# Patient Record
Sex: Female | Born: 1985 | Race: Black or African American | Hispanic: No | Marital: Single | Smoking: Never smoker
Health system: Southern US, Community
[De-identification: ages and names within clinical notes are randomized; demographics above are authoritative.]

## PROBLEM LIST (undated history)

## (undated) DIAGNOSIS — B977 Papillomavirus as the cause of diseases classified elsewhere: Secondary | ICD-10-CM

## (undated) HISTORY — PX: OTHER SURGICAL HISTORY: SHX169

## (undated) HISTORY — PX: TUBAL LIGATION: SHX77

---
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
## 2004-03-15 ENCOUNTER — Other Ambulatory Visit: Admission: RE | Admit: 2004-03-15 | Discharge: 2004-03-15 | Payer: Self-pay | Admitting: Obstetrics and Gynecology

## 2004-03-18 ENCOUNTER — Other Ambulatory Visit: Admission: RE | Admit: 2004-03-18 | Discharge: 2004-03-18 | Payer: Self-pay | Admitting: Obstetrics & Gynecology

## 2004-04-29 ENCOUNTER — Ambulatory Visit (HOSPITAL_COMMUNITY): Admission: RE | Admit: 2004-04-29 | Discharge: 2004-04-29 | Payer: Self-pay | Admitting: Obstetrics and Gynecology

## 2004-07-15 ENCOUNTER — Other Ambulatory Visit: Admission: RE | Admit: 2004-07-15 | Discharge: 2004-07-15 | Payer: Self-pay | Admitting: Obstetrics and Gynecology

## 2004-09-21 ENCOUNTER — Inpatient Hospital Stay (HOSPITAL_COMMUNITY): Admission: AD | Admit: 2004-09-21 | Discharge: 2004-09-21 | Payer: Self-pay | Admitting: Obstetrics and Gynecology

## 2004-09-29 ENCOUNTER — Inpatient Hospital Stay (HOSPITAL_COMMUNITY): Admission: AD | Admit: 2004-09-29 | Discharge: 2004-10-01 | Payer: Self-pay | Admitting: Obstetrics and Gynecology

## 2005-03-16 IMAGING — US US ABDOMEN COMPLETE
1 series · 14 of 25 positions shown · non-contrast
Comparison: none

CLINICAL DATA: Abdominal pain.
 COMPLETE ABDOMINAL ULTRASOUND 12/28/03 
 The gallbladder is normal in appearance.  There is no biliary ductal dilation as the common duct measures 4 mm.  The liver is normal in size and echo texture and contains no focal abnormalities.  The inferior vena cava is patent.  The pancreas is well-visualized and is normal.  The spleen is normal in size and appearance and measures 7.4 cm.  
 Both kidneys are normal in size and echo texture and contain no focal abnormalities.  There is no hydronephrosis.  There are no focal parenchymal abnormalities.  The right kidney measures 10.7 cm and the left 10.7 cm.  The abdominal aorta is of normal caliber through its visualized course in the abdomen. 
 IMPRESSION
 Normal abdominal ultrasound.

[Series 1: abdomen · 0.33mm/px · 14 of 78 slices shown]
[im 1/78]
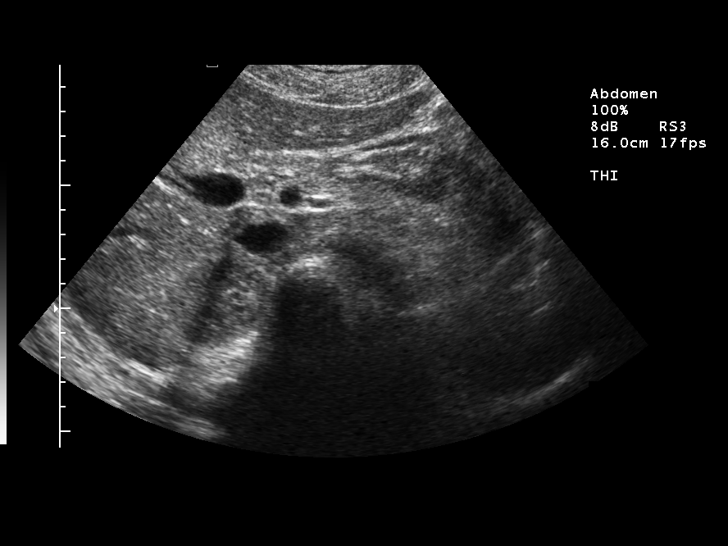
[im 7/78]
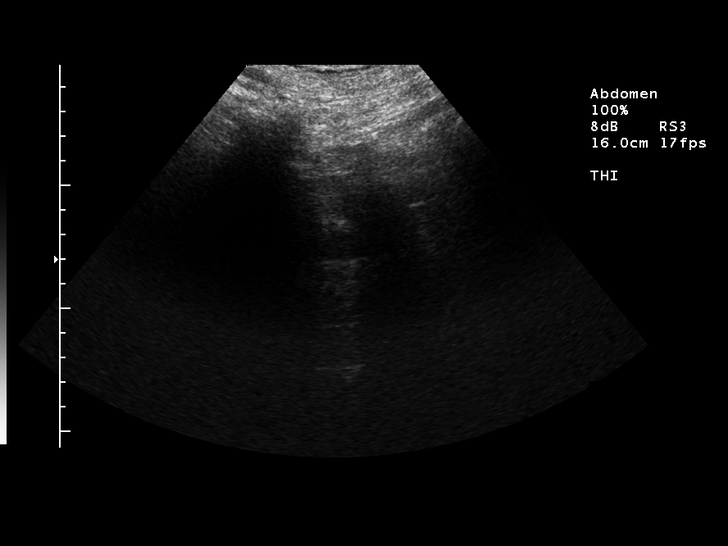
[im 13/78]
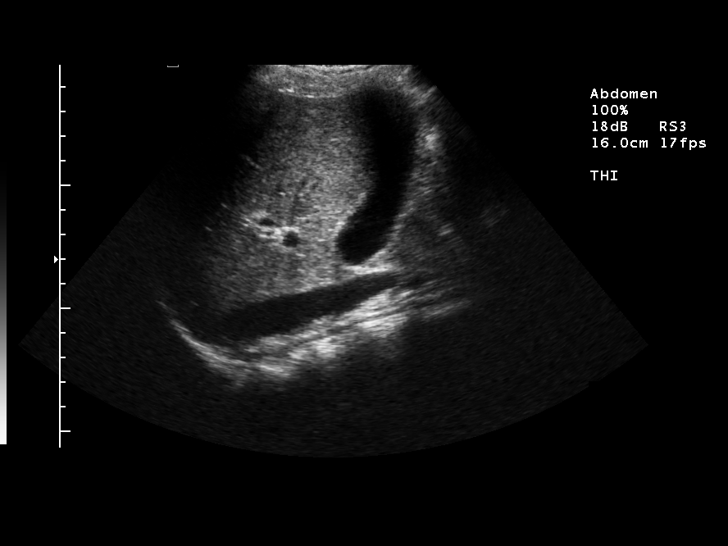
[im 20/78]
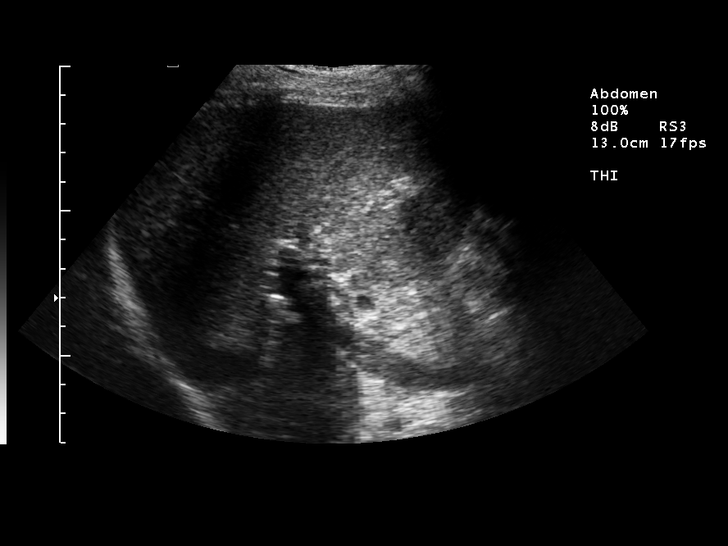
[im 26/78]
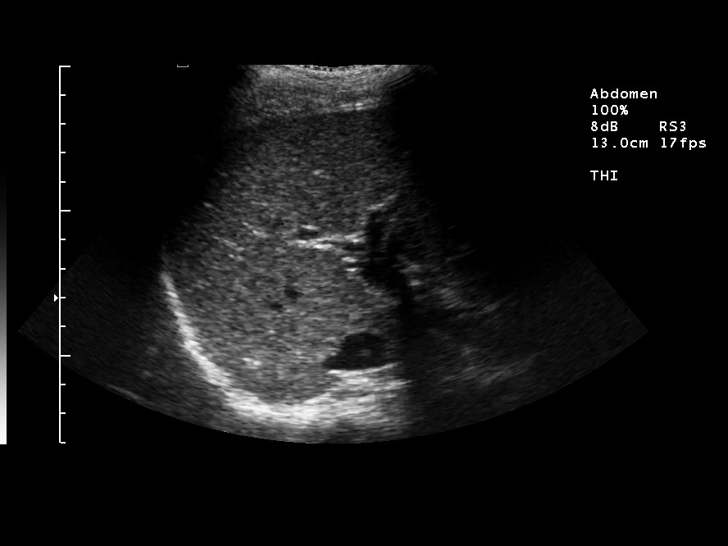
[im 29/78]
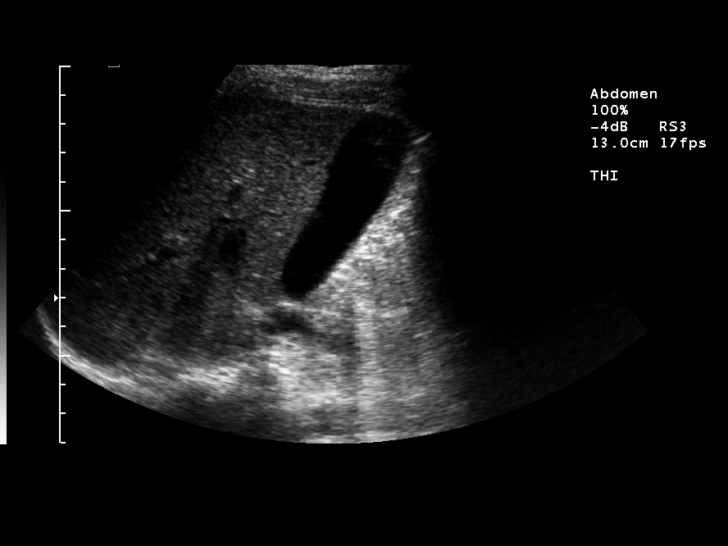
[im 36/78]
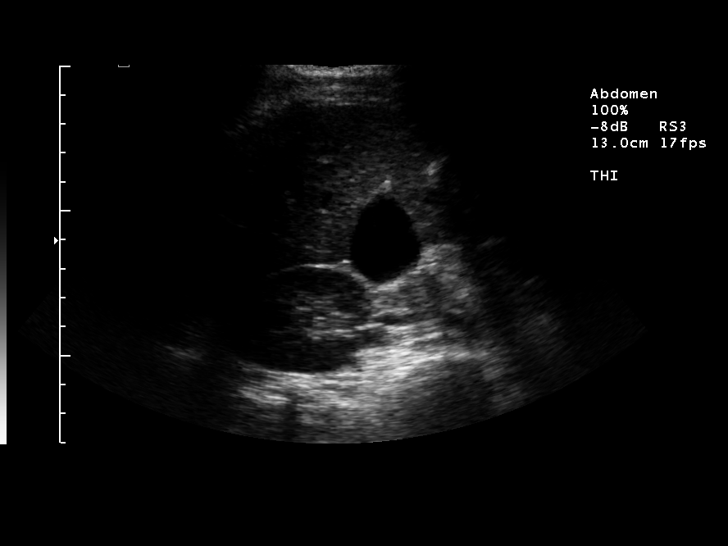
[im 42/78]
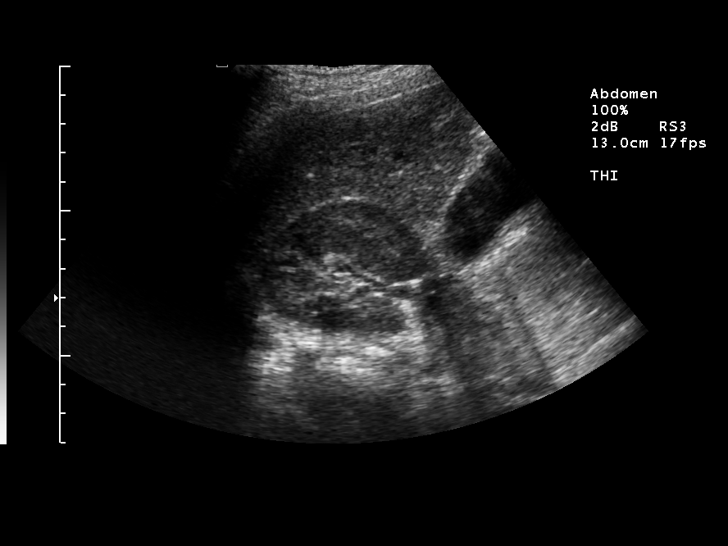
[im 49/78]
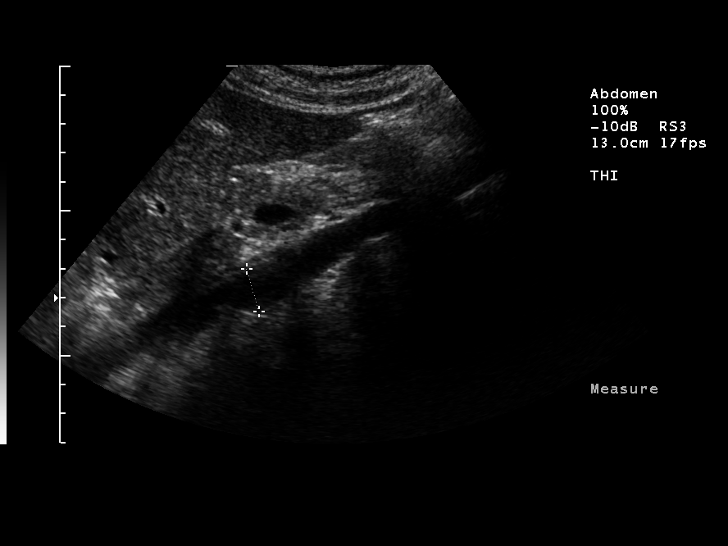
[im 52/78]
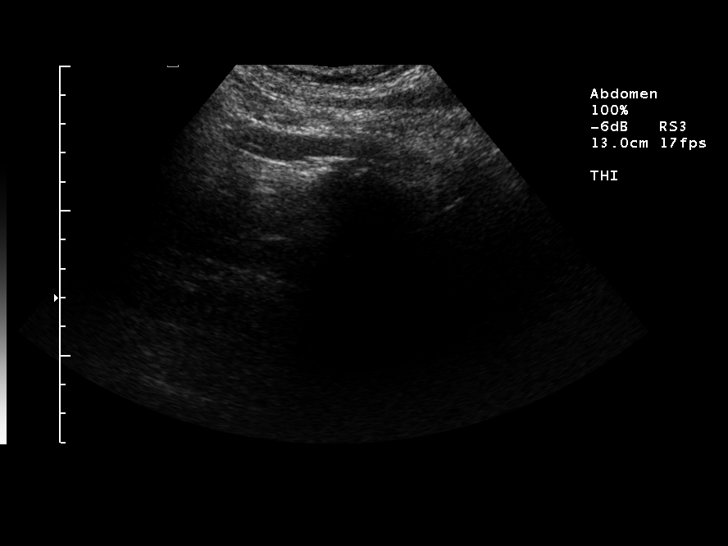
[im 58/78]
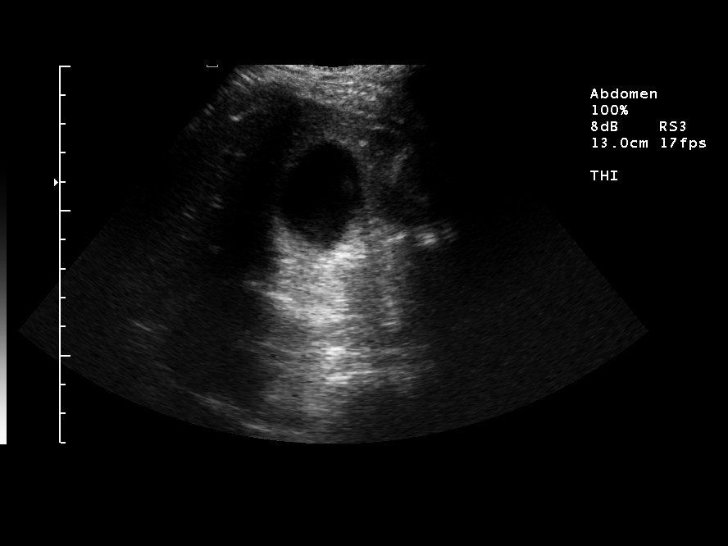
[im 65/78]
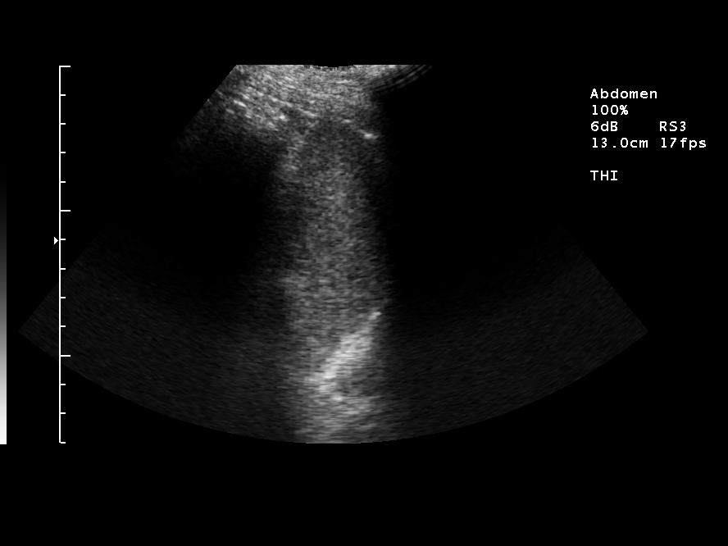
[im 71/78]
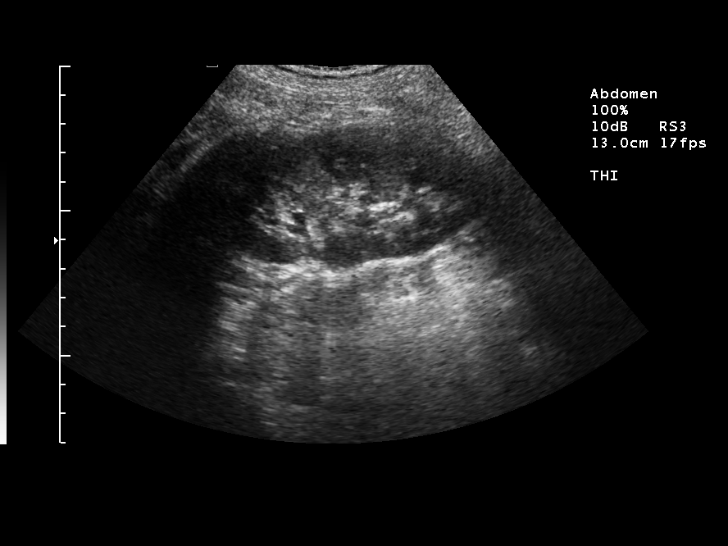
[im 78/78]
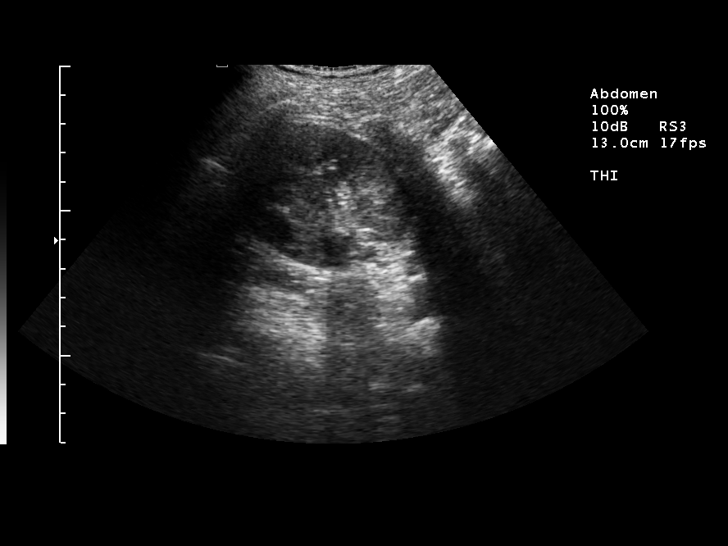

[14 of 25 positions shown; findings below may reference images not displayed]

## 2005-07-17 IMAGING — US US OB COMP +14 WK
1 series · 2 of 2 positions shown · non-contrast
Comparison: none

CLINICAL DATA: G2 P0 AB1.  LMP 12/19/03.  Assess fetal anatomy and gestational age.

[Series 1: unknown · 0.25mm/px · 2 of 2 slices shown]
[im 1/2]
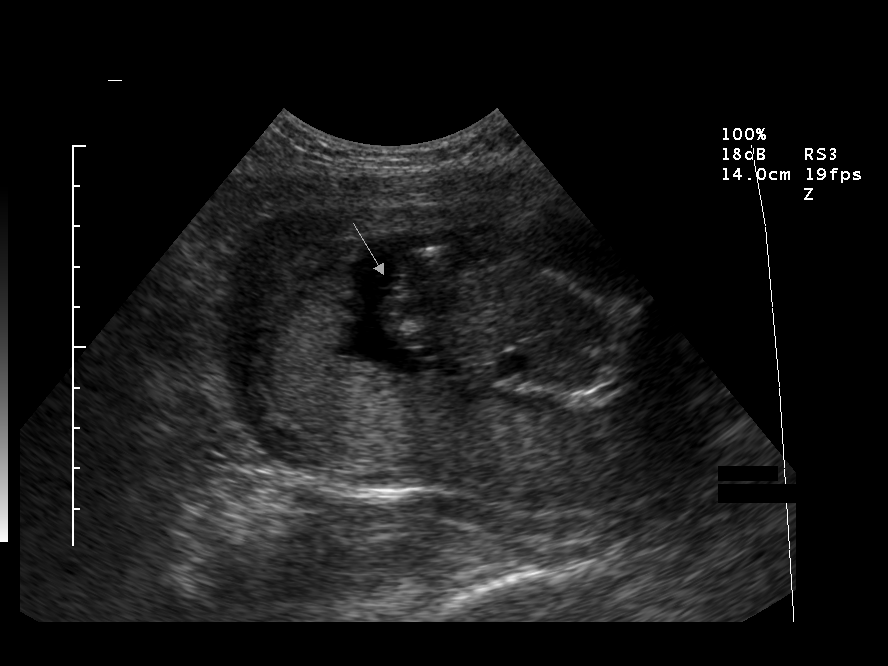
[im 2/2]
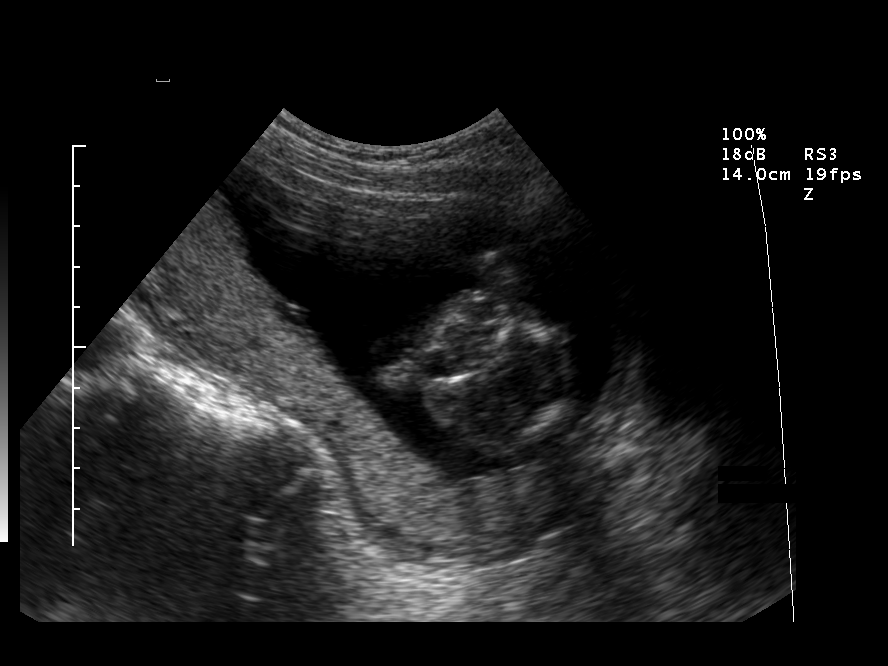

[2 of 2 positions shown; findings below may reference images not displayed]

OBSTETRICAL ULTRASOUND WTH TRANSVAGINAL:
Number of Fetuses:  1
Heart Rate:  153
Movement:  Yes
Breathing:    No  
Presentation:  Cephalic 
Placental Location:  Posterior
Grade:  I
Previa:  Asymmetric complete
Amniotic Fluid (Subjective):  Normal      Amniotic Fluid (Objective):  3.5 cm Vertical pocket 
FETAL BIOMETRY
BPD:    3.6 cm   17 w 0 d
HC:  12.5 cm   16 w 2 d
AC:  10.1 cm   16 w 1 d
FL:    2.0 cm   16 w 0 d

MEAN GA:  16 w 3 d

FETAL ANATOMY
Lateral Ventricles:    Visualized 
Thalami/CSP:      Visualized 
Posterior Fossa:  Visualized 
Nuchal Region:    Visualized 
Spine:      Visualized 
4 Chamber Heart on Left:      Visualized 
Stomach on Left:      Visualized 
3 Vessel Cord:    Visualized 
Cord Insertion site:    Visualized 
Kidneys:  Visualized 
Bladder:  Visualized 
Extremities:      Visualized 

ADDITIONAL ANATOMY VISUALIZED:  LVOT, RVOT, upper, lip, orbits, profile, diaphragm, heel, 5th digit, ductal arch, aortic arch, and female genitalia

MATERNAL FINDINGS
Cervix:   4.3 cm Transvaginally
IMPRESSION: Single living intrauterine fetus in cephalic presentation.  Patient is 18 weeks 6 days by LMP dating, but measures 16 weeks 3 days today suggesting that dates may be inaccurate.  
No focal fetal abnormality identified.  Posterior asymmetric complete placenta previa.  The lower margin is just covering the internal os.  Suggest follow-up study later in pregnancy.  
Normal amniotic fluid volume.

</u12:p>

## 2005-11-11 ENCOUNTER — Emergency Department (HOSPITAL_COMMUNITY): Admission: EM | Admit: 2005-11-11 | Discharge: 2005-11-11 | Payer: Self-pay | Admitting: Emergency Medicine

## 2006-07-04 ENCOUNTER — Inpatient Hospital Stay (HOSPITAL_COMMUNITY): Admission: AD | Admit: 2006-07-04 | Discharge: 2006-07-06 | Payer: Self-pay | Admitting: Obstetrics and Gynecology

## 2006-07-31 ENCOUNTER — Ambulatory Visit (HOSPITAL_COMMUNITY): Admission: RE | Admit: 2006-07-31 | Discharge: 2006-07-31 | Payer: Self-pay | Admitting: Obstetrics & Gynecology

## 2008-02-19 ENCOUNTER — Emergency Department (HOSPITAL_COMMUNITY): Admission: EM | Admit: 2008-02-19 | Discharge: 2008-02-19 | Payer: Self-pay | Admitting: Emergency Medicine

## 2008-04-28 ENCOUNTER — Emergency Department (HOSPITAL_COMMUNITY): Admission: EM | Admit: 2008-04-28 | Discharge: 2008-04-28 | Payer: Self-pay | Admitting: Emergency Medicine

## 2008-05-30 ENCOUNTER — Emergency Department (HOSPITAL_COMMUNITY): Admission: EM | Admit: 2008-05-30 | Discharge: 2008-05-30 | Payer: Self-pay | Admitting: Emergency Medicine

## 2009-01-10 ENCOUNTER — Emergency Department (HOSPITAL_COMMUNITY): Admission: EM | Admit: 2009-01-10 | Discharge: 2009-01-10 | Payer: Self-pay | Admitting: Family Medicine

## 2010-04-30 ENCOUNTER — Emergency Department (HOSPITAL_COMMUNITY): Admission: EM | Admit: 2010-04-30 | Discharge: 2010-05-01 | Payer: Self-pay | Admitting: Emergency Medicine

## 2011-01-17 NOTE — Op Note (Signed)
[facility]  Patient:    Lisa Luna, Lisa Luna Visit Number: [id number] MRN: [id number]          Service Type: DSU Location: [facility] Attending Physician:  Mickle Mallory Dictated by:   Gerrit Friends. Aldona Bar, M.D. Proc. Date: 03/11/02 Admit Date:  03/11/2002 Discharge Date: 03/11/2002                             Operative Report  PREOPERATIVE DIAGNOSIS:       First trimester pregnancy loss/inevitable miscarriage/blood type B positive.  POSTOPERATIVE DIAGNOSIS:      First trimester pregnancy loss/inevitable miscarriage/blood type B positive.  Pathology pending.  OPERATION:                    Suction dilatation and curettage for evacuation of first trimester pregnancy loss.  SURGEON:                      Gerrit Friends. Aldona Bar, M.D.  INDICATIONS:                  This 25 year old female was seen in the office today having called last evening because of cramping and bleeding.  She had been previously seen in the office early on this pregnancy and had been dated appropriately - her gestational age on previous ultrasound was consistent with her last menstrual period which would put her at approximately [redacted] weeks gestation at this time.  She was seen in the office this morning as mentioned, and there was a small amount of bleeding coming from the cervical os.  No fetal heart tone was heard, and on bimanual exam, the uterus was not consistent with that of a 13 week size uterus.  An ultrasound was done with findings consistent with approximately a 9 to 9-1/2 intrauterine pregnancy - a fetal pole was seen but no fetal heart was seen.  A diagnosis of a first trimester pregnancy loss/inevitable abortion was made.  The patient was sent to [facility] where she had a confirmatory ultrasound consistent with my ultrasound in the office.  She is now taken to the operating room for evacuation of first trimester pregnancy loss.  DESCRIPTION OF PROCEDURE:  The patient  was taken to the operating room where after the satisfactory induction of intravenous conscious sedation, she was prepped and draped having been placed in the usual position - modified lithotomy in the short Allen stirrups.  After she was adequately prepped, the bladder was drained of clear urine with the red rubber catheter in an in and out fashion.  At this time, a speculum was placed in the vagina.  The cervix was well-visualized, and a single-tooth tenaculum was attached to the cervix for uterine stability during the procedure.  A paracervical block was carried out at this time with 1% Xylocaine without epinephrine - amounting to approximately 15 cc.  Thereafter, with minimal difficulty, the internal os was dilated to a #31 Pratt dilator and with ease a #10 suction curet was then used to thoroughly evacuate the cavity.  Curettage was continued until it was felt to be complete.  Thereafter using the medium standard curet, the cavity was curetted and noted to be free of any remaining products of conception.  Resuctioning produced no additional tissue.  At this time, the procedure was felt to be complete.  All instruments were removed.  The patient at this time was transported to the  recovery room in satisfactory condition having tolerated the procedure well.  Estimated blood loss was 25 cc.  All counts were correct x2.  Specimens consisted of products of conception.  The patient will be discharged to home with appropriate instruction sheet.  DISCHARGE MEDICATIONS:        Doxycycline 100 mg twice daily for a total of four days and Anaprox DS one every eight hours as needed for discomfort or cramps.  CONDITION:                    Condition on arrival in the recovery room is satisfactory.   DESCRIPTION OF PROCEDURE: Dictated by:   Gerrit Friends. Aldona Bar, M.D. Attending Physician:  Mickle Mallory DD:  03/11/02 TD:  03/14/02 Job: [id number]

## 2011-01-17 NOTE — Op Note (Signed)
NAMEQUANTINA, DERSHEM            ACCOUNT NO.:  [id number]   MEDICAL RECORD NO.:  [id number]          PATIENT TYPE:  AMB   LOCATION:  SDC                           FACILITY:  [facility]   PHYSICIAN:  Randye Lobo, M.D.   DATE OF BIRTH:  Apr 09, 1986   DATE OF PROCEDURE:  07/31/2006  DATE OF DISCHARGE:                               OPERATIVE REPORT   PREOPERATIVE DIAGNOSIS:  Multiparous female, desires permanent  sterilization.   POSTOPERATIVE DIAGNOSIS:  Multiparous female, desires permanent  sterilization.   PROCEDURE:  Laparoscopic bilateral tubal ligation with Filshie clips.   SURGEON:  Randye Lobo, M.D.   ANESTHESIA:  General endotracheal, local with 0.25% Marcaine.   ESTIMATED BLOOD LOSS:  Minimal.   URINE OUTPUT:  100 mL by I&O catheter..   COMPLICATIONS:  None.   INDICATIONS FOR THE PROCEDURE:  The patient is a 25 year old gravida 3,  para 2-0-0-2 African American female status post normal spontaneous  vaginal delivery of a viable female on 07/04/2006 who throughout her  pregnancy and post partum requested permanent sterilization.  The  patient declines reversible contraception; and she chooses to have  permanent sterilization; and states that she will have no regret.  A  discussion was held with the patient regarding other options for  reversible contraception which she has declined.  She understands that  there is a failure rate of a tubal ligation of approximately 1:250 to  1:300 which may result in either an intrauterine or an ectopic  pregnancy.  The patient chooses to proceed.   FINDINGS:  Laparoscopy demonstrates a normal uterus, tubes and ovaries.  There is no evidence of any endometriosis or adhesive disease in the  abdomen or pelvis.  The liver and gallbladder are unremarkable.  The  appendix appears normal.   SPECIMENS:  None.   DESCRIPTION OF PROCEDURE:  The patient is reidentified in the  preoperative hold area.  She received Ancef 1 gram IV for  antibiotic  prophylaxis.  In the operating room the patient received general  endotracheal anesthesia.  She was then placed in the dorsal lithotomy  position.  The abdomen and vagina were sterilely prepped and draped.  The patient's bladder was catheterized of urine.  A speculum was placed  inside the vagina and a single-tooth tenaculum was placed on the  anterior cervical lip and this was replaced by a Hulka tenaculum.  The  speculum was then withdrawn.   The procedure began by making a 1.5 cm vertical umbilical incision with  a scalpel.  The abdominal wall was noted to be very thin, at this site.  The umbilicus was elevated with two Allis clamps as the skin was incised  and this incision allowed direct entry into the peritoneal cavity.  The  10-mm trocar was placed in the abdomen and the laparoscope did confirm  proper placement.  A pneumoperitoneum was achieved.  An inspection of  the pelvic and abdominal organs was performed and the findings were as  noted above.  A 10-mm operative laparoscope was then used to perform the  tubal ligation with Filshie  clips.  The Filshie  clip was placed inside  the clip applicator and the laparoscope and the Filshie clip applier  were placed inside the peritoneal cavity.  The right fallopian tube was  followed all the way to its fimbriated end.  It was then grasped along  the isthmic portion, and the Filshie clip was applied without  difficulty.  This was noted to have good and complete placement across  the entire isthmic portion of the tube.  The same procedure that was  performed on the right-hand side was then repeated on the left fallopian  tube, after that fallopian tube was followed to its fimbriated end.   The operative sites were noted to be hemostatic at this time.  There  were no complications to the procedure.  The laparoscope and Filshie  clip applicator were removed; and the pneumoperitoneum was released.  The umbilical trocar was  removed.   The umbilical fascia was closed with a figure-of-eight suture of #0  Vicryl.  The skin was then closed with subcuticular sutures of 3-0  plain.  Local 0.25% Marcaine was injected locally in the umbilical  incisions.  A sterile bandage was placed over the incision.   The Hulka tenaculum was removed from the vagina.  This concluded the  patient's procedure.  There were no complications.  All needle,  instrument, and sponge counts were correct.      Randye Lobo, M.D.  Electronically Signed     BES/MEDQ  D:  07/31/2006  T:  07/31/2006  Job:  [id number]

## 2011-01-17 NOTE — Op Note (Signed)
[place]  Patient:    Lisa Luna                    MRN: [id number] Proc. Date: 07/23/99 Adm. Date:  [id number] Attending:  Ronne Binning CC:         Nicki Reaper, M.D. (2)                           Operative Report  PREOPERATIVE DIAGNOSIS:  Rupture, ulnar collateral ligament, with a fracture of the proximal phalanx, metacarpal-phalangeal joint, left thumb.  POSTOPERATIVE DIAGNOSIS:  Rupture, ulnar collateral ligament, with a fracture of the proximal phalanx, metacarpal-phalangeal joint, left thumb.  OPERATION:  Open reduction and internal fixation of the proximal phalanx, intra-articular fracture, left thumb.  SURGEON:  Nicki Reaper, M.D.  ASSISTANT:  Joaquin Courts, R.N.  ANESTHESIA:  General.  ANESTHESIOLOGIST:  Edwin Cap. Zoila Shutter, M.D.  INDICATIONS:  The patient is a 25 year old female who suffered an injury to her  left thumb.  X-rays reveal a fracture of the proximal phalanx, detachment of the ulnar collateral ligament, with rotation of the fracture fragment.  DESCRIPTION OF PROCEDURE:  The patient was brought to the operating room where  general anesthesia was carried out without difficulty.  She was prepped and draped using Betadine scrub and solution, with the left arm free.  The limb was exsanguinated with an Esmarch bandage and the tourniquet placed high on the arm was inflated to 250 mmHg.  A curvilinear incision was made over the ulnar side dorsally of the metacarpal-phalangeal joint, and carried down through the subcutaneous tissues.  Bleeders were electrocauterized.  The dorsal sensory nerve was identified and protected.  The adductor aponeurosis was incised with sharp dissection. The fracture was immediately apparent.  This was found to be rotated 90 degrees, with the articular surface placed into the fracture fragment on the remainder of the  proximal phalanx.  A second fracture was present, which  was nondisplaced.  The rea was opened, debrided.  The 082 K-wires were passed from the radial aspect of the proximal phalanx into the fracture fragment site.  This was then withdrawn after localization.  The fracture was then reduced.  The two pins were then passed across the fracture, stabilizing.  X-rays taken in the AP lateral direction revealed reconstitution of the articular surface.  The area was irrigated.  A third 082 K-wire was then passed across the joint to stabilize it.  Again x-rays were taken in the AP lateral direction revealed that the joint was stable, with the pins in good position.  The adductor aponeurosis was then closed with figure-of-eight #4-0 Vicryl sutures.  The subcutaneous tissue was closed with #4-0 Vicryl, and the skin with a subcuticular #4-0 monocryl suture.  Steri-Strips were applied.  A sterile compressive dressing and thumb Spica splint applied.  The patient tolerated the procedure well and was taken to the recovery room for  observation in satisfactory condition.  DISPOSITION:  She is discharged home, to return to the [facility] in one week, on Vicodin and Keflex. DD:  07/23/99 TD:  07/23/99 Job: [id number]

## 2011-05-29 LAB — POCT PREGNANCY, URINE
Operator id: 282151
Preg Test, Ur: NEGATIVE

## 2011-05-29 LAB — WET PREP, GENITAL
Trich, Wet Prep: NONE SEEN
Yeast Wet Prep HPF POC: NONE SEEN

## 2011-05-29 LAB — POCT URINALYSIS DIP (DEVICE)
Ketones, ur: NEGATIVE
Nitrite: NEGATIVE

## 2011-05-29 LAB — GC/CHLAMYDIA PROBE AMP, GENITAL: GC Probe Amp, Genital: NEGATIVE

## 2011-07-29 ENCOUNTER — Emergency Department (INDEPENDENT_AMBULATORY_CARE_PROVIDER_SITE_OTHER)
Admission: EM | Admit: 2011-07-29 | Discharge: 2011-07-29 | Disposition: A | Discharge status: Home / Self Care | Payer: Self-pay | Source: Home / Self Care

## 2011-07-29 ENCOUNTER — Encounter: Payer: Self-pay | Admitting: Family Medicine

## 2011-07-29 DIAGNOSIS — J329 Chronic sinusitis, unspecified: Secondary | ICD-10-CM

## 2011-07-29 DIAGNOSIS — J069 Acute upper respiratory infection, unspecified: Secondary | ICD-10-CM

## 2011-07-29 MED ORDER — PREDNISONE 20 MG PO TABS: ORAL_TABLET | ORAL | Status: AC

## 2011-07-29 MED ORDER — IBUPROFEN 600 MG PO TABS: 600.0000 mg | ORAL_TABLET | Freq: Three times a day (TID) | ORAL | Status: AC | PRN

## 2011-07-29 MED ORDER — FEXOFENADINE-PSEUDOEPHED ER 60-120 MG PO TB12: 1.0000 | ORAL_TABLET | Freq: Two times a day (BID) | ORAL | Status: DC

## 2011-07-29 MED ORDER — AZITHROMYCIN 250 MG PO TABS: ORAL_TABLET | ORAL | Status: AC

## 2011-07-29 NOTE — ED Notes (Signed)
For over a week, body aches, cough, runny nose, fever as high as 102.

## 2011-07-29 NOTE — ED Notes (Signed)
Discharge pending receiving work note .  Patient and physician had discussed multiple days off from work.  Patient provided work note

## 2011-07-29 NOTE — ED Provider Notes (Signed)
History     CSN: [id number] Arrival date & time: 07/29/2011  8:23 AM   First MD Initiated Contact with Patient 07/29/11 [id number]      Chief Complaint  Patient presents with  . URI    (Consider location/radiation/quality/duration/timing/severity/associated sxs/prior treatment) Patient is a 25 y.o. female presenting with fever. The history is provided by the patient.  Fever Primary symptoms of the febrile illness include fever, headaches and cough. Primary symptoms do not include wheezing, shortness of breath, abdominal pain, nausea, vomiting, myalgias, arthralgias or rash. The current episode started 6 to 7 days ago. Primary symptoms comment: cough and congestion for about 1 week, fever up ro 102 for the last 4 days. Associated with congestion, headache and sinus pressure.    History reviewed. No pertinent past medical history.  Past Surgical History  Procedure Date  . Tubal ligation     History reviewed. No pertinent family history.  History  Substance Use Topics  . Smoking status: Never Smoker   . Smokeless tobacco: Not on file  . Alcohol Use: No    OB History    Grav Para Term Preterm Abortions TAB SAB Ect Mult Living                 Obstetric Comments   s/p bilateral tubal ligation      Review of Systems  Constitutional: Positive for fever and appetite change. Negative for chills and activity change.  HENT: Positive for congestion, rhinorrhea and sinus pressure. Negative for ear pain, sore throat, facial swelling, neck pain and voice change.   Respiratory: Positive for cough. Negative for chest tightness, shortness of breath and wheezing.   Cardiovascular: Negative for chest pain and leg swelling.  Gastrointestinal: Negative for nausea, vomiting and abdominal pain.  Musculoskeletal: Negative for myalgias and arthralgias.  Skin: Negative for rash.  Neurological: Positive for headaches.    Allergies  Review of patient's allergies indicates no known  allergies.  Home Medications   Current Outpatient Rx  Name Route Sig Dispense Refill  . NYQUIL PO Oral Take by mouth.      Marland Kitchen PSEUDOEPHEDRINE-ACETAMINOPHEN 30-500 MG PO TABS Oral Take 1 tablet by mouth every 4 (four) hours as needed.      . AZITHROMYCIN 250 MG PO TABS  2 tabs po on day#1 then 1 tab daily for 4 more days. 6 each 0  . FEXOFENADINE-PSEUDOEPHEDRINE 60-120 MG PO TB12 Oral Take 1 tablet by mouth every 12 (twelve) hours. 30 tablet 0  . IBUPROFEN 600 MG PO TABS Oral Take 1 tablet (600 mg total) by mouth every 8 (eight) hours as needed for pain. 30 tablet 0    Take with food.  Marland Kitchen PREDNISONE 20 MG PO TABS  2 tabs po daily for 5 days 10 tablet no    BP 116/77  Pulse 91  Temp(Src) 101.4 F (38.6 C) (Oral)  Resp 18  SpO2 98%  LMP 07/14/2011  Physical Exam  Nursing note and vitals reviewed. Constitutional: She is oriented to person, place, and time. She appears well-developed and well-nourished. No distress.  HENT:  Head: Normocephalic.  Right Ear: Tympanic membrane, external ear and ear canal normal.  Left Ear: Tympanic membrane, external ear and ear canal normal.  Nose: Mucosal edema and rhinorrhea present. Right sinus exhibits maxillary sinus tenderness. Left sinus exhibits maxillary sinus tenderness.  Eyes: Conjunctivae and EOM are normal. Pupils are equal, round, and reactive to light.  Neck: Normal range of motion.  Cardiovascular: Normal rate, regular rhythm  and normal heart sounds.   Pulmonary/Chest: Breath sounds normal. No respiratory distress. She has no wheezes. She has no rales. She exhibits no tenderness.  Abdominal: Soft. There is no tenderness.  Musculoskeletal: Normal range of motion. She exhibits no edema and no tenderness.  Lymphadenopathy:    She has no cervical adenopathy.  Neurological: She is alert and oriented to person, place, and time.  Skin: No rash noted.    ED Course  Procedures (including critical care time)  Labs Reviewed - No data to  display No results found.   1. Sinusitis   2. URI (upper respiratory infection)       MDM  Treated with prednisone, azithromycin, antihistamin and decongestant.        Sharin Grave, MD 07/31/11 1417

## 2012-02-01 ENCOUNTER — Encounter (HOSPITAL_COMMUNITY): Payer: Self-pay | Admitting: *Deleted

## 2012-02-01 ENCOUNTER — Emergency Department (HOSPITAL_COMMUNITY)
Admission: EM | Admit: 2012-02-01 | Discharge: 2012-02-01 | Disposition: A | Discharge status: Home / Self Care | Payer: Self-pay | Attending: Emergency Medicine | Admitting: Emergency Medicine

## 2012-02-01 DIAGNOSIS — Z043 Encounter for examination and observation following other accident: Secondary | ICD-10-CM | POA: Insufficient documentation

## 2012-02-01 HISTORY — DX: Papillomavirus as the cause of diseases classified elsewhere: B97.7

## 2012-02-01 MED ORDER — HYDROCODONE-ACETAMINOPHEN 5-500 MG PO TABS: 1.0000 | ORAL_TABLET | Freq: Four times a day (QID) | ORAL | Status: AC | PRN

## 2012-02-01 MED ORDER — OXYCODONE-ACETAMINOPHEN 5-325 MG PO TABS
1.0000 | ORAL_TABLET | Freq: Once | ORAL | Status: AC
  Administered 2012-02-01: 1 via ORAL
  Filled 2012-02-01: qty 1

## 2012-02-01 MED ORDER — CYCLOBENZAPRINE HCL 10 MG PO TABS: 10.0000 mg | ORAL_TABLET | Freq: Two times a day (BID) | ORAL | Status: AC | PRN

## 2012-02-01 MED ORDER — CYCLOBENZAPRINE HCL 10 MG PO TABS
5.0000 mg | ORAL_TABLET | Freq: Once | ORAL | Status: AC
  Administered 2012-02-01: 5 mg via ORAL
  Filled 2012-02-01: qty 1
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
## 2012-02-05 NOTE — ED Provider Notes (Signed)
Medical screening examination/treatment/procedure(s) were performed by non-physician practitioner and as supervising physician I was immediately available for consultation/collaboration.  Jaylen Knope, MD 02/05/12 0353 

## 2012-06-18 ENCOUNTER — Emergency Department (INDEPENDENT_AMBULATORY_CARE_PROVIDER_SITE_OTHER)
Admission: EM | Admit: 2012-06-18 | Discharge: 2012-06-18 | Disposition: A | Discharge status: Home / Self Care | Payer: Self-pay | Source: Home / Self Care | Attending: Family Medicine | Admitting: Family Medicine

## 2012-06-18 ENCOUNTER — Encounter (HOSPITAL_COMMUNITY): Payer: Self-pay | Admitting: *Deleted

## 2012-06-18 DIAGNOSIS — A499 Bacterial infection, unspecified: Secondary | ICD-10-CM

## 2012-06-18 DIAGNOSIS — J309 Allergic rhinitis, unspecified: Secondary | ICD-10-CM

## 2012-06-18 DIAGNOSIS — J302 Other seasonal allergic rhinitis: Secondary | ICD-10-CM

## 2012-06-18 DIAGNOSIS — N760 Acute vaginitis: Secondary | ICD-10-CM

## 2012-06-18 LAB — WET PREP, GENITAL
Trich, Wet Prep: NONE SEEN
Yeast Wet Prep HPF POC: NONE SEEN

## 2012-06-18 LAB — POCT URINALYSIS DIP (DEVICE): Leukocytes, UA: NEGATIVE

## 2012-06-18 MED ORDER — IPRATROPIUM BROMIDE 0.06 % NA SOLN: 2.0000 | Freq: Four times a day (QID) | NASAL | Status: AC

## 2012-06-18 MED ORDER — FLUCONAZOLE 150 MG PO TABS: 150.0000 mg | ORAL_TABLET | Freq: Once | ORAL | Status: AC

## 2012-06-18 MED ORDER — METRONIDAZOLE 0.75 % VA GEL: 1.0000 | VAGINAL | Status: AC

## 2012-06-18 NOTE — ED Notes (Signed)
Pt  Has  Symptoms  Of  Vaginal  Discharge  With  Foul  Smelling   Discharge       Symptoms         X  1  Week  -     She  Also  Reports   Symptoms  Of    Sinus  Congestion as  Well          She  Reports  Some  Spotting     As  Well

## 2012-06-18 NOTE — ED Provider Notes (Signed)
History     CSN: [id number]  Arrival date & time 06/18/12  7829   First MD Initiated Contact with Patient 06/18/12 [phone]      Chief Complaint  Patient presents with  . Vaginal Discharge    (Consider location/radiation/quality/duration/timing/severity/associated sxs/prior treatment) Patient is a 26 y.o. female presenting with vaginal discharge. The history is provided by the patient.  Vaginal Discharge This is a new problem. The current episode started more than 1 week ago. The problem has not changed since onset.Pertinent negatives include no abdominal pain.    Past Medical History  Diagnosis Date  . HPV (human papilloma virus) infection     Past Surgical History  Procedure Date  . Tubal ligation   . Tubal ligation   . Thumb surgery     History reviewed. No pertinent family history.  History  Substance Use Topics  . Smoking status: Never Smoker   . Smokeless tobacco: Not on file  . Alcohol Use: No    OB History    Grav Para Term Preterm Abortions TAB SAB Ect Mult Living                 Obstetric Comments   s/p bilateral tubal ligation      Review of Systems  Constitutional: Negative.   HENT: Positive for congestion, rhinorrhea and postnasal drip.   Gastrointestinal: Negative.  Negative for abdominal pain.  Genitourinary: Positive for vaginal bleeding, vaginal discharge and menstrual problem. Negative for vaginal pain and pelvic pain.    Allergies  Review of patient's allergies indicates no known allergies.  Home Medications   Current Outpatient Rx  Name Route Sig Dispense Refill  . FLUCONAZOLE 150 MG PO TABS Oral Take 1 tablet (150 mg total) by mouth once. 1 tablet 0  . IPRATROPIUM BROMIDE 0.06 % NA SOLN Nasal Place 2 sprays into the nose 4 (four) times daily. 15 mL 1  . METRONIDAZOLE 0.75 % VA GEL Vaginal Place 1 Applicatorful vaginally 1 day or 1 dose. At bedtime for 5 nights 70 g 0  . ADULT MULTIVITAMIN W/MINERALS CH Oral Take 1 tablet by mouth  daily.    Marland Kitchen OVER THE COUNTER MEDICATION Oral Take 2 capsules by mouth daily as needed. For tension headaches.      BP 123/64  Pulse 70  Temp 98.4 F (36.9 C) (Oral)  Resp 16  SpO2 100%  LMP 05/06/2012  Physical Exam  Nursing note and vitals reviewed. Constitutional: She appears well-developed and well-nourished.  HENT:  Head: Normocephalic.  Right Ear: External ear normal.  Left Ear: External ear normal.  Nose: Mucosal edema and rhinorrhea present.  Mouth/Throat: Oropharynx is clear and moist.  Eyes: Pupils are equal, round, and reactive to light.  Genitourinary: Uterus is not enlarged and not tender. Cervix exhibits no motion tenderness and no discharge. Right adnexum displays no tenderness. Left adnexum displays no tenderness. There is bleeding around the vagina. No erythema or tenderness around the vagina. No vaginal discharge found.    ED Course  Procedures (including critical care time)  Labs Reviewed  POCT URINALYSIS DIP (DEVICE) - Abnormal; Notable for the following:    Hgb urine dipstick MODERATE (*)     All other components within normal limits  POCT PREGNANCY, URINE   No results found.   1. Bacterial vaginosis   2. Seasonal allergic rhinitis       MDM          Linna Hoff, MD 06/18/12 1019

## 2012-06-19 LAB — GC/CHLAMYDIA PROBE AMP, GENITAL
Chlamydia, DNA Probe: NEGATIVE
GC Probe Amp, Genital: NEGATIVE

## 2012-09-15 ENCOUNTER — Other Ambulatory Visit (HOSPITAL_COMMUNITY)
Admission: RE | Admit: 2012-09-15 | Discharge: 2012-09-15 | Disposition: A | Discharge status: Home / Self Care | Payer: 59 | Source: Ambulatory Visit | Attending: Obstetrics and Gynecology | Admitting: Obstetrics and Gynecology

## 2012-09-15 ENCOUNTER — Other Ambulatory Visit: Payer: Self-pay | Admitting: Obstetrics and Gynecology

## 2012-09-15 DIAGNOSIS — Z113 Encounter for screening for infections with a predominantly sexual mode of transmission: Secondary | ICD-10-CM | POA: Insufficient documentation

## 2012-09-15 DIAGNOSIS — N760 Acute vaginitis: Secondary | ICD-10-CM | POA: Insufficient documentation

## 2012-09-15 DIAGNOSIS — Z01419 Encounter for gynecological examination (general) (routine) without abnormal findings: Secondary | ICD-10-CM | POA: Insufficient documentation

## 2013-09-19 ENCOUNTER — Other Ambulatory Visit: Payer: Self-pay | Admitting: Obstetrics and Gynecology

## 2013-09-19 ENCOUNTER — Other Ambulatory Visit (HOSPITAL_COMMUNITY)
Admission: RE | Admit: 2013-09-19 | Discharge: 2013-09-19 | Disposition: A | Discharge status: Home / Self Care | Payer: 59 | Source: Ambulatory Visit | Attending: Obstetrics and Gynecology | Admitting: Obstetrics and Gynecology

## 2013-09-19 DIAGNOSIS — Z01419 Encounter for gynecological examination (general) (routine) without abnormal findings: Secondary | ICD-10-CM | POA: Insufficient documentation

## 2014-07-03 ENCOUNTER — Encounter (HOSPITAL_COMMUNITY): Payer: Self-pay | Admitting: *Deleted

## 2014-09-29 ENCOUNTER — Other Ambulatory Visit (HOSPITAL_COMMUNITY)
Admission: RE | Admit: 2014-09-29 | Discharge: 2014-09-29 | Disposition: A | Discharge status: Home / Self Care | Payer: 59 | Source: Ambulatory Visit | Attending: Obstetrics and Gynecology | Admitting: Obstetrics and Gynecology

## 2014-09-29 ENCOUNTER — Other Ambulatory Visit: Payer: Self-pay | Admitting: Obstetrics and Gynecology

## 2014-09-29 DIAGNOSIS — Z113 Encounter for screening for infections with a predominantly sexual mode of transmission: Secondary | ICD-10-CM | POA: Diagnosis present

## 2014-09-29 DIAGNOSIS — Z1151 Encounter for screening for human papillomavirus (HPV): Secondary | ICD-10-CM | POA: Diagnosis present

## 2014-09-29 DIAGNOSIS — Z01419 Encounter for gynecological examination (general) (routine) without abnormal findings: Secondary | ICD-10-CM | POA: Diagnosis not present

## 2014-10-02 LAB — CYTOLOGY - PAP

## 2014-11-06 ENCOUNTER — Other Ambulatory Visit: Payer: Self-pay | Admitting: Internal Medicine

## 2014-11-06 ENCOUNTER — Ambulatory Visit
Admission: RE | Admit: 2014-11-06 | Discharge: 2014-11-06 | Disposition: A | Discharge status: Home / Self Care | Payer: 59 | Source: Ambulatory Visit | Attending: Internal Medicine | Admitting: Internal Medicine

## 2014-11-06 DIAGNOSIS — M25531 Pain in right wrist: Secondary | ICD-10-CM

## 2015-10-16 ENCOUNTER — Other Ambulatory Visit: Payer: Self-pay | Admitting: Obstetrics and Gynecology

## 2015-10-16 ENCOUNTER — Other Ambulatory Visit (HOSPITAL_COMMUNITY)
Admission: RE | Admit: 2015-10-16 | Discharge: 2015-10-16 | Disposition: A | Discharge status: Home / Self Care | Payer: 59 | Source: Ambulatory Visit | Attending: Obstetrics and Gynecology | Admitting: Obstetrics and Gynecology

## 2015-10-16 DIAGNOSIS — Z01419 Encounter for gynecological examination (general) (routine) without abnormal findings: Secondary | ICD-10-CM | POA: Diagnosis not present

## 2015-10-17 LAB — CYTOLOGY - PAP
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
## 2016-04-04 ENCOUNTER — Ambulatory Visit
Admission: RE | Admit: 2016-04-04 | Discharge: 2016-04-04 | Disposition: A | Discharge status: Home / Self Care | Payer: 59 | Source: Ambulatory Visit | Attending: Internal Medicine | Admitting: Internal Medicine

## 2016-04-04 ENCOUNTER — Other Ambulatory Visit: Payer: Self-pay | Admitting: Internal Medicine

## 2016-04-04 DIAGNOSIS — M25531 Pain in right wrist: Secondary | ICD-10-CM

## 2017-12-16 ENCOUNTER — Other Ambulatory Visit: Payer: Self-pay | Admitting: Obstetrics and Gynecology

## 2017-12-16 ENCOUNTER — Other Ambulatory Visit (HOSPITAL_COMMUNITY)
Admission: RE | Admit: 2017-12-16 | Discharge: 2017-12-16 | Disposition: A | Discharge status: Home / Self Care | Payer: BLUE CROSS/BLUE SHIELD | Source: Ambulatory Visit | Attending: Obstetrics and Gynecology | Admitting: Obstetrics and Gynecology

## 2017-12-16 DIAGNOSIS — Z124 Encounter for screening for malignant neoplasm of cervix: Secondary | ICD-10-CM | POA: Insufficient documentation

## 2017-12-17 LAB — CYTOLOGY - PAP
DIAGNOSIS: NEGATIVE
HPV: NOT DETECTED

## 2018-07-14 ENCOUNTER — Ambulatory Visit (HOSPITAL_COMMUNITY)
Admission: EM | Admit: 2018-07-14 | Discharge: 2018-07-14 | Disposition: A | Discharge status: Home / Self Care | Payer: BLUE CROSS/BLUE SHIELD | Attending: Family Medicine | Admitting: Family Medicine

## 2018-07-14 ENCOUNTER — Encounter (HOSPITAL_COMMUNITY): Payer: Self-pay

## 2018-07-14 ENCOUNTER — Other Ambulatory Visit: Payer: Self-pay

## 2018-07-14 DIAGNOSIS — M25532 Pain in left wrist: Secondary | ICD-10-CM

## 2018-07-14 MED ORDER — PREDNISONE 50 MG PO TABS: 50.0000 mg | ORAL_TABLET | Freq: Every day | ORAL | 0 refills | Status: AC

## 2018-07-14 NOTE — ED Triage Notes (Signed)
Pt cc left arm pain x 2 weeks

## 2018-07-14 NOTE — ED Provider Notes (Signed)
[facility]    CSN: [id number] Arrival date & time: 07/14/18  0848     History   Chief Complaint Chief Complaint  Patient presents with  . Arm Pain    HPI Lisa Luna is a 32 y.o. female.   32 year old female comes in for 2-week history of left wrist pain that radiates down the fingers as well as up the arm.  Denies injury/trauma.  States pain mostly focused on the ventral aspect of the wrist, and has numbness tingling to the 3rd-5th finger intermittently.  Denies swelling.  States fingers feels cold without discoloration.  Has been taking aspirin, Tylenol, ibuprofen without relief.     Past Medical History:  Diagnosis Date  . HPV (human papilloma virus) infection     There are no active problems to display for this patient.   Past Surgical History:  Procedure Laterality Date  . thumb surgery    . TUBAL LIGATION    . TUBAL LIGATION      OB History   None    Obstetric Comments  s/p bilateral tubal ligation         Home Medications    Prior to Admission medications   Medication Sig Start Date End Date Taking? Authorizing Provider  fluconazole (DIFLUCAN) 150 MG tablet Take 1 tablet (150 mg total) by mouth once. 06/18/12   Linna HoffKindl, James D, MD  ipratropium (ATROVENT) 0.06 % nasal spray Place 2 sprays into the nose 4 (four) times daily. 06/18/12   Linna HoffKindl, James D, MD  metroNIDAZOLE (METROGEL VAGINAL) 0.75 % vaginal gel Place 1 Applicatorful vaginally 1 day or 1 dose. At bedtime for 5 nights 06/18/12   Linna HoffKindl, James D, MD  Multiple Vitamin (MULITIVITAMIN WITH MINERALS) TABS Take 1 tablet by mouth daily.    [provider]  OVER THE COUNTER MEDICATION Take 2 capsules by mouth daily as needed. For tension headaches.    [provider]  predniSONE (DELTASONE) 50 MG tablet Take 1 tablet (50 mg total) by mouth daily. 07/14/18   Belinda FisherYu, Torii Royse V, PA-C    Family History Family History  Problem Relation Age of Onset  . Healthy Mother   .  Healthy Father     Social History Social History   Tobacco Use  . Smoking status: Never Smoker  . Smokeless tobacco: Never Used  Substance Use Topics  . Alcohol use: No  . Drug use: No     Allergies   Patient has no known allergies.   Review of Systems Review of Systems  Reason unable to perform ROS: See HPI as above.     Physical Exam Triage Vital Signs ED Triage Vitals  Enc Vitals Group     BP 07/14/18 0920 115/78     Pulse Rate 07/14/18 0920 65     Resp 07/14/18 0920 16     Temp 07/14/18 0920 98.8 F (37.1 C)     Temp Source 07/14/18 0920 Oral     SpO2 07/14/18 0920 99 %     Weight 07/14/18 0921 138 lb (62.6 kg)     Height --      Head Circumference --      Peak Flow --      Pain Score 07/14/18 0921 6     Pain Loc --      Pain Edu? --      Excl. in GC? --    No data found.  Updated Vital Signs BP 115/78 (BP Location: Left Arm)  Pulse 65   Temp 98.8 F (37.1 C) (Oral)   Resp 16   Wt 138 lb (62.6 kg)   LMP 06/26/2018   SpO2 99%   Physical Exam  Constitutional: She is oriented to person, place, and time. She appears well-developed and well-nourished. No distress.  HENT:  Head: Normocephalic and atraumatic.  Eyes: Pupils are equal, round, and reactive to light. Conjunctivae are normal.  Musculoskeletal:  No swelling, erythema, warmth, contusion seen. Tenderness to palpation of ventral wrist. Full ROM of wrist, finger, elbow, shoulder, neck. Strength normal and equal bilaterally. Sensation intact and equal bilaterally. Radial pulse 2+, cap refill 3 seconds bilaterally.   Negative Tinel's, Phalen's, Finkelstein  Neurological: She is alert and oriented to person, place, and time.  Skin: She is not diaphoretic.     UC Treatments / Results  Labs (all labs ordered are listed, but only abnormal results are displayed) Labs Reviewed - No data to display  EKG None  Radiology No results found.  Procedures Procedures (including critical care  time)  Medications Ordered in UC Medications - No data to display  Initial Impression / Assessment and Plan / UC Course  I have reviewed the triage vital signs and the nursing notes.  Pertinent labs & imaging results that were available during my care of the patient were reviewed by me and considered in my medical decision making (see chart for details).    Will treat for tendinitis at this time. Prednisone as directed. Wrist splint, ice compress, elevation. Discussed cap refill at 3s on exam. Will have patient push fluids and keep hand warm. Patient to follow up with orthopedics if symptoms not improving.  Final Clinical Impressions(s) / UC Diagnoses   Final diagnoses:  Left wrist pain    ED Prescriptions    Medication Sig Dispense Auth. Provider   predniSONE (DELTASONE) 50 MG tablet Take 1 tablet (50 mg total) by mouth daily. 5 tablet Threasa Alpha, [place] 07/14/18 [phone]

## 2018-07-14 NOTE — Discharge Instructions (Addendum)
Start prednisone as directed. Ice compress, elevation, wrist splint during activity. This may take a few weeks to resolve, but should be feeling better each week. Follow up with orthopedics for further evaluation if symptoms not improving.
